# Patient Record
Sex: Female | Born: 1995 | Race: Black or African American | Hispanic: No | Marital: Single | Smoking: Never smoker
Health system: Southern US, Community
[De-identification: ages and names within clinical notes are randomized; demographics above are authoritative.]

## PROBLEM LIST (undated history)

## (undated) ENCOUNTER — Inpatient Hospital Stay (HOSPITAL_COMMUNITY): Payer: Self-pay

## (undated) DIAGNOSIS — J45909 Unspecified asthma, uncomplicated: Secondary | ICD-10-CM

## (undated) HISTORY — DX: Unspecified asthma, uncomplicated: J45.909

---

## 2014-10-18 ENCOUNTER — Encounter (HOSPITAL_COMMUNITY): Payer: Self-pay | Admitting: Emergency Medicine

## 2014-10-18 ENCOUNTER — Emergency Department (HOSPITAL_COMMUNITY)
Admission: EM | Admit: 2014-10-18 | Discharge: 2014-10-18 | Disposition: A | Discharge status: Home / Self Care | Payer: No Typology Code available for payment source | Attending: Emergency Medicine | Admitting: Emergency Medicine

## 2014-10-18 DIAGNOSIS — Y9389 Activity, other specified: Secondary | ICD-10-CM | POA: Insufficient documentation

## 2014-10-18 DIAGNOSIS — M62838 Other muscle spasm: Secondary | ICD-10-CM | POA: Insufficient documentation

## 2014-10-18 DIAGNOSIS — Y998 Other external cause status: Secondary | ICD-10-CM | POA: Diagnosis not present

## 2014-10-18 DIAGNOSIS — S199XXA Unspecified injury of neck, initial encounter: Secondary | ICD-10-CM | POA: Diagnosis not present

## 2014-10-18 DIAGNOSIS — Y92410 Unspecified street and highway as the place of occurrence of the external cause: Secondary | ICD-10-CM | POA: Insufficient documentation

## 2014-10-18 MED ORDER — METHOCARBAMOL 500 MG PO TABS
500.0000 mg | ORAL_TABLET | Freq: Four times a day (QID) | ORAL | Status: DC
Start: 2014-10-18 — End: 2020-01-04
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
## 2019-01-17 ENCOUNTER — Ambulatory Visit: Payer: Medicaid Other | Attending: Internal Medicine

## 2019-01-17 DIAGNOSIS — Z20822 Contact with and (suspected) exposure to covid-19: Secondary | ICD-10-CM

## 2019-01-18 LAB — NOVEL CORONAVIRUS, NAA: SARS-CoV-2, NAA: NOT DETECTED

## 2019-05-06 ENCOUNTER — Emergency Department (HOSPITAL_COMMUNITY): Payer: Medicaid Other

## 2019-05-06 ENCOUNTER — Encounter (HOSPITAL_COMMUNITY): Payer: Self-pay | Admitting: Emergency Medicine

## 2019-05-06 ENCOUNTER — Emergency Department (HOSPITAL_COMMUNITY)
Admission: EM | Admit: 2019-05-06 | Discharge: 2019-05-06 | Disposition: A | Discharge status: Home / Self Care | Payer: Medicaid Other | Attending: Emergency Medicine | Admitting: Emergency Medicine

## 2019-05-06 DIAGNOSIS — Z79899 Other long term (current) drug therapy: Secondary | ICD-10-CM | POA: Insufficient documentation

## 2019-05-06 DIAGNOSIS — B9689 Other specified bacterial agents as the cause of diseases classified elsewhere: Secondary | ICD-10-CM

## 2019-05-06 DIAGNOSIS — Z87891 Personal history of nicotine dependence: Secondary | ICD-10-CM | POA: Insufficient documentation

## 2019-05-06 DIAGNOSIS — A5901 Trichomonal vulvovaginitis: Secondary | ICD-10-CM | POA: Insufficient documentation

## 2019-05-06 DIAGNOSIS — N760 Acute vaginitis: Secondary | ICD-10-CM | POA: Insufficient documentation

## 2019-05-06 DIAGNOSIS — N83201 Unspecified ovarian cyst, right side: Secondary | ICD-10-CM

## 2019-05-06 DIAGNOSIS — R102 Pelvic and perineal pain: Secondary | ICD-10-CM | POA: Insufficient documentation

## 2019-05-06 LAB — URINALYSIS, ROUTINE W REFLEX MICROSCOPIC
Bilirubin Urine: NEGATIVE
Glucose, UA: NEGATIVE mg/dL
Ketones, ur: NEGATIVE mg/dL
Nitrite: NEGATIVE
Protein, ur: NEGATIVE mg/dL
Specific Gravity, Urine: 1.026 (ref 1.005–1.030)
pH: 6 (ref 5.0–8.0)

## 2019-05-06 LAB — CBC
HCT: 34.6 % — ABNORMAL LOW (ref 36.0–46.0)
Hemoglobin: 10.6 g/dL — ABNORMAL LOW (ref 12.0–15.0)
MCH: 23 pg — ABNORMAL LOW (ref 26.0–34.0)
MCHC: 30.6 g/dL (ref 30.0–36.0)
MCV: 75.1 fL — ABNORMAL LOW (ref 80.0–100.0)
Platelets: 392 10*3/uL (ref 150–400)
RBC: 4.61 MIL/uL (ref 3.87–5.11)
RDW: 14.5 % (ref 11.5–15.5)
WBC: 12.3 10*3/uL — ABNORMAL HIGH (ref 4.0–10.5)
nRBC: 0 % (ref 0.0–0.2)

## 2019-05-06 LAB — COMPREHENSIVE METABOLIC PANEL
ALT: 25 U/L (ref 0–44)
AST: 38 U/L (ref 15–41)
Albumin: 3.3 g/dL — ABNORMAL LOW (ref 3.5–5.0)
Alkaline Phosphatase: 68 U/L (ref 38–126)
Anion gap: 10 (ref 5–15)
BUN: 11 mg/dL (ref 6–20)
CO2: 23 mmol/L (ref 22–32)
Calcium: 8.9 mg/dL (ref 8.9–10.3)
Chloride: 107 mmol/L (ref 98–111)
Creatinine, Ser: 0.91 mg/dL (ref 0.44–1.00)
GFR calc Af Amer: >60 mL/min (ref >60)
GFR calc non Af Amer: >60 mL/min (ref >60)
Glucose, Bld: 97 mg/dL (ref 70–99)
Potassium: 3.7 mmol/L (ref 3.5–5.1)
Sodium: 140 mmol/L (ref 135–145)
Total Bilirubin: <0.1 mg/dL — ABNORMAL LOW (ref 0.3–1.2)
Total Protein: 7.3 g/dL (ref 6.5–8.1)

## 2019-05-06 LAB — WET PREP, GENITAL
Sperm: NONE SEEN
Yeast Wet Prep HPF POC: NONE SEEN

## 2019-05-06 LAB — LIPASE, BLOOD: Lipase: 30 U/L (ref 11–51)

## 2019-05-06 LAB — I-STAT BETA HCG BLOOD, ED (MC, WL, AP ONLY): I-stat hCG, quantitative: <5 m[IU]/mL (ref <5)

## 2019-05-06 MED ORDER — CEFTRIAXONE SODIUM 500 MG IJ SOLR
500.0000 mg | Freq: Once | INTRAMUSCULAR | Status: AC
  Administered 2019-05-06: 500 mg via INTRAMUSCULAR
  Filled 2019-05-06: qty 500

## 2019-05-06 MED ORDER — STERILE WATER FOR INJECTION IJ SOLN
INTRAMUSCULAR | Status: AC
  Administered 2019-05-06: 1 mL
  Filled 2019-05-06: qty 10

## 2019-05-06 MED ORDER — METRONIDAZOLE 500 MG PO TABS: 500.0000 mg | ORAL_TABLET | Freq: Two times a day (BID) | ORAL | 0 refills | Status: DC

## 2019-05-06 MED ORDER — ONDANSETRON 4 MG PO TBDP
4.0000 mg | ORAL_TABLET | Freq: Once | ORAL | Status: AC
  Administered 2019-05-06: 4 mg via ORAL
  Filled 2019-05-06: qty 1

## 2019-05-06 MED ORDER — AZITHROMYCIN 250 MG PO TABS
1000.0000 mg | ORAL_TABLET | Freq: Once | ORAL | Status: AC
  Administered 2019-05-06: 1000 mg via ORAL
  Filled 2019-05-06: qty 4

## 2019-05-06 MED ORDER — IOHEXOL 350 MG/ML SOLN: 100.0000 mL | Freq: Once | INTRAVENOUS | Status: DC | PRN

## 2019-05-06 MED ORDER — METRONIDAZOLE 500 MG PO TABS
500.0000 mg | ORAL_TABLET | Freq: Once | ORAL | Status: AC
  Administered 2019-05-06: 500 mg via ORAL
  Filled 2019-05-06: qty 1

## 2019-05-06 MED ORDER — SODIUM CHLORIDE 0.9% FLUSH: 3.0000 mL | Freq: Once | INTRAVENOUS | Status: DC

## 2019-05-06 MED ORDER — IOHEXOL 300 MG/ML  SOLN
100.0000 mL | Freq: Once | INTRAMUSCULAR | Status: AC | PRN
  Administered 2019-05-06: 100 mL via INTRAVENOUS

## 2019-05-06 MED ORDER — ACETAMINOPHEN 500 MG PO TABS
1000.0000 mg | ORAL_TABLET | Freq: Once | ORAL | Status: AC
  Administered 2019-05-06: 1000 mg via ORAL
  Filled 2019-05-06: qty 2

## 2019-05-06 MED ORDER — SODIUM CHLORIDE 0.9 % IV SOLN: Freq: Once | INTRAVENOUS | Status: DC

## 2019-05-06 NOTE — ED Provider Notes (Signed)
[facility] EMERGENCY DEPARTMENT Provider Note   CSN: [id number] Arrival date & time: 05/06/19  0154     History Chief Complaint  Patient presents with  . Abdominal Pain    Kimberly Campos is a 24 y.o. female.  HPI Patient is a 24 year old female with no past surgical history no pertinent past medical history presented today with right lower quadrant abdominal pain that is severe, achy, radiates to her back, worse with walking.  She states there is when she takes a deep breath but is not painful with normal breathing.  She denies any cough or shortness of breath.  She denies any chest pain or nausea or vomiting.  She states she has had some pain with intercourse for the past 7 days and states that this pain has been constant for 7 days.  She denies any vaginal discharge, urinary symptoms, headache, lightheadedness, fevers, chills, changes in appetite.  Patient states she denies any progression of pain from the umbilicus to the right lower quadrant abdomen.      History reviewed. No pertinent past medical history.  There are no problems to display for this patient.   History reviewed. No pertinent surgical history.   OB History   No obstetric history on file.     No family history on file.  Social History   Tobacco Use  . Smoking status: Former Games developer  . Smokeless tobacco: Never Used  Substance Use Topics  . Alcohol use: Yes  . Drug use: No    Home Medications Prior to Admission medications   Medication Sig Start Date End Date Taking? Authorizing Provider  methocarbamol (ROBAXIN) 500 MG tablet Take 1 tablet (500 mg total) by mouth 4 (four) times daily. 10/18/14   Pisciotta, Joni Reining, PA-C  metroNIDAZOLE (FLAGYL) 500 MG tablet Take 1 tablet (500 mg total) by mouth 2 (two) times daily. 05/06/19   Gailen Shelter, PA    Allergies    Coconut flavor  Review of Systems   Review of Systems  Constitutional: Negative for chills and fever.  HENT:  Negative for congestion.   Eyes: Negative for pain.  Respiratory: Negative for cough and shortness of breath.   Cardiovascular: Negative for chest pain and leg swelling.  Gastrointestinal: Positive for abdominal pain. Negative for constipation, diarrhea, nausea and vomiting.  Genitourinary: Positive for pelvic pain. Negative for dysuria, frequency, genital sores, hematuria, menstrual problem, vaginal bleeding, vaginal discharge and vaginal pain.  Musculoskeletal: Negative for myalgias.  Skin: Negative for rash.  Neurological: Negative for dizziness and headaches.    Physical Exam Updated Vital Signs BP 116/74 (BP Location: Right Arm)   Pulse 67   Temp 98.7 F (37.1 C) (Oral)   Resp 17   Ht 5\' 8"  (1.727 m)   Wt 75.3 kg   LMP 04/15/2019   SpO2 98%   BMI 25.24 kg/m   Physical Exam Vitals and nursing note reviewed.  Constitutional:      General: She is not in acute distress. HENT:     Head: Normocephalic and atraumatic.     Nose: Nose normal.  Eyes:     General: No scleral icterus. Cardiovascular:     Rate and Rhythm: Normal rate and regular rhythm.     Pulses: Normal pulses.     Heart sounds: Normal heart sounds.  Pulmonary:     Effort: Pulmonary effort is normal. No respiratory distress.     Breath sounds: No wheezing.  Abdominal:     Palpations: Abdomen is  soft.     Tenderness: There is abdominal tenderness.     Comments: Right lower quadrant tenderness to palpation, right periumbilical tenderness to palpation, left lower quadrant tenderness to palpation.  No guarding or rigidity.  No rebound tenderness.  Positive heel jar.  Positive obturator sign negative psoas.   Genitourinary:    Cervix: Normal.     Comments: Vulva without lesions or abnormality Vaginal canal without with malodorous watery discharge Cervix appears normal, is closed, spotting present. Significant right adnexal tenderness, no left adnexal tenderness, no cervical motion tenderness. Musculoskeletal:       Cervical back: Normal range of motion.     Right lower leg: No edema.     Left lower leg: No edema.  Skin:    General: Skin is warm and dry.     Capillary Refill: Capillary refill takes less than 2 seconds.  Neurological:     Mental Status: She is alert. Mental status is at baseline.  Psychiatric:        Mood and Affect: Mood normal.        Behavior: Behavior normal.     ED Results / Procedures / Treatments   Labs (all labs ordered are listed, but only abnormal results are displayed) Labs Reviewed  WET PREP, GENITAL - Abnormal; Notable for the following components:      Result Value   Trich, Wet Prep PRESENT (*)    Clue Cells Wet Prep HPF POC PRESENT (*)    WBC, Wet Prep HPF POC MANY (*)    All other components within normal limits  COMPREHENSIVE METABOLIC PANEL - Abnormal; Notable for the following components:   Albumin 3.3 (*)    Total Bilirubin <0.1 (*)    All other components within normal limits  CBC - Abnormal; Notable for the following components:   WBC 12.3 (*)    Hemoglobin 10.6 (*)    HCT 34.6 (*)    MCV 75.1 (*)    MCH 23.0 (*)    All other components within normal limits  URINALYSIS, ROUTINE W REFLEX MICROSCOPIC - Abnormal; Notable for the following components:   APPearance HAZY (*)    Hgb urine dipstick SMALL (*)    Leukocytes,Ua MODERATE (*)    Bacteria, UA RARE (*)    All other components within normal limits  LIPASE, BLOOD  I-STAT BETA HCG BLOOD, ED ([facility], [facility], [facility] ONLY)  GC/CHLAMYDIA PROBE AMP ([place]) NOT AT [facility]    EKG None  Radiology CT ABDOMEN PELVIS W CONTRAST  Result Date: 05/06/2019 CLINICAL DATA:  Lower abdominal pain, worse on the right, appendicitis suspected EXAM: CT ABDOMEN AND PELVIS WITH CONTRAST TECHNIQUE: Multidetector CT imaging of the abdomen and pelvis was performed using the standard protocol following bolus administration of intravenous contrast. CONTRAST:  OMNIPAQUE IOHEXOL 300 MG/ML  SOLN COMPARISON:  None.  FINDINGS: Lower chest: No acute abnormality. Hepatobiliary: No solid liver abnormality is seen. No gallstones, gallbladder wall thickening, or biliary dilatation. Pancreas: Unremarkable. No pancreatic ductal dilatation or surrounding inflammatory changes. Spleen: Normal in size without significant abnormality. Adrenals/Urinary Tract: Adrenal glands are unremarkable. Kidneys are normal, without renal calculi, solid lesion, or hydronephrosis. Bladder is unremarkable. Stomach/Bowel: Stomach is within normal limits. Appendix appears normal. No evidence of bowel wall thickening, distention, or inflammatory changes. Vascular/Lymphatic: No significant vascular findings are present. No enlarged abdominal or pelvic lymph nodes. Reproductive: The right ovary is enlarged and heterogeneous in appearance, containing multiple cystic and solid components measuring 6.4 x 4.1 cm (  series 4, image 71) Other: No abdominal wall hernia or abnormality. Trace fluid in the low pelvis. Musculoskeletal: No acute or significant osseous findings. IMPRESSION: 1.  Normal appendix. 2. The right ovary is enlarged and heterogeneous in appearance, containing multiple cystic and solid components measuring 6.4 x 4.1 cm, this appearance better characterized by prior ultrasound. 3.  Trace, nonspecific fluid in the low pelvis. Electronically Signed   By: Eddie Candle M.D.   On: 05/06/2019 12:08   US PELVIC COMPLETE W TRANSVAGINAL AND TORSION R/O  Result Date: 05/06/2019 CLINICAL DATA:  Lower abdominal pain for 1 week, worse on the right, left adnexal tenderness to palpation EXAM: TRANSABDOMINAL AND TRANSVAGINAL ULTRASOUND OF PELVIS DOPPLER ULTRASOUND OF OVARIES TECHNIQUE: Both transabdominal and transvaginal ultrasound examinations of the pelvis were performed. Transabdominal technique was performed for global imaging of the pelvis including uterus, ovaries, adnexal regions, and pelvic cul-de-sac. It was necessary to proceed with endovaginal exam  following the transabdominal exam to visualize the uterus, endometrium, ovaries, and adnexa. Color and duplex Doppler ultrasound was utilized to evaluate blood flow to the ovaries. COMPARISON:  None. FINDINGS: Uterus Measurements: 7.2 x 4.4 x 5.0 cm = volume: 84 mL. There are small mural fibroids in the anterior and posterior uterine body measuring 1.6 and 0.7 cm respectively, which do not clearly have significant submucosal components. Endometrium Thickness: 9 mm.  No focal abnormality visualized. Right ovary Measurements: 6.2 x 3.4 x 5.1 cm = volume: 55 mL. The right ovary is enlarged by a cyst with internal thin, webbed septations measuring 2.7 cm, consistent with a hemorrhagic cyst, and a heterogeneous solid mass with fatty echogenicity internal components measuring 4.5 cm. Left ovary Measurements: 4.0 x 1.0 x 2.3 cm = volume: 5 mL. Normal appearance/no adnexal mass. Pulsed Doppler evaluation of both ovaries demonstrates normal low-resistance arterial and venous waveforms. Other findings Small volume free fluid in the low pelvis. IMPRESSION: 1. 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 There is normal arterial and venous Doppler flow present to the ovary, however intermittent or incomplete torsion cannot be strictly excluded in any enlarged ovary in the setting of acutely referable pain. 2.  Normal left ovary. 3.  Uterine fibroids. 4.  Small volume nonspecific free fluid in the low pelvis. Electronically Signed   By: Eddie Candle M.D.   On: 05/06/2019 11:21    Procedures Ultrasound ED Peripheral IV (Provider)  Date/Time: 05/06/2019 10:24 AM Performed by: Tedd Sias, PA Authorized by: Tedd Sias, PA   Procedure details:    Indications comment:  Assisting nursing staff   Skin Prep:  chlorhexidine gluconate     Location:  Left AC   Angiocath:  18 G   Bedside Ultrasound Guided: No     Patient tolerated procedure without complications: Yes     Dressing applied: Yes     (including critical care time)   Medications Ordered in ED Medications  sodium chloride flush (NS) 0.9 % injection 3 mL (has no administration in time range)  0.9 %  sodium chloride infusion ( Intravenous Not Given 05/06/19 1258)  iohexol (OMNIPAQUE) 300 MG/ML solution 100 mL (100 mLs Intravenous Contrast Given 05/06/19 1049)  acetaminophen (TYLENOL) tablet 1,000 mg (1,000 mg Oral Given 05/06/19 1257)  metroNIDAZOLE (FLAGYL) tablet 500 mg (500 mg Oral Given 05/06/19 1257)  cefTRIAXone (  ROCEPHIN) injection 500 mg (500 mg Intramuscular Given 05/06/19 1257)  azithromycin (ZITHROMAX) tablet 1,000 mg (1,000 mg Oral Given 05/06/19 1257)  ondansetron (ZOFRAN-ODT) disintegrating tablet 4 mg (4 mg Oral Given 05/06/19 1257)  sterile water (preservative free) injection (1 mL  Given 05/06/19 1258)    ED Course  I have reviewed the triage vital signs and the nursing notes.  Pertinent labs & imaging results that were available during my care of the patient were reviewed by me and considered in my medical decision making (see chart for details).   Clinical Course as of May 06 1311  Sun May 06, 2019  0858 CBC with very mild leukocytosis of 12.3.  Hemoglobin 10.6 patient states that she has had low hemoglobins in the past.  No prior labs to compare this to today.  She is not tachycardic or hypotensive-she is not symptomatically anemic today   [WF]  0858 CMP without acute abnormality electrolytes within normal limits.   [WF]  0859 I-STAT hCG negative.  Lipase within normal limits doubt pancreatitis.  Urinalysis pending.   [WF]  1214 Trichomonas, clue cells consistent with BV present on exam.  Wet prep, genital(!) [WF]    Clinical Course User Index [WF] Solon Augusta S, PA  Months of infection.  She has had some  spotting from her cervix on my exam which I suspect accounts for the small amount of globin on her urinalysis.  She has been moderate leukocytes and rare bacteria however has no urinary symptoms.  Doubt UTI.   I discussed this case with my attending physician who cosigned this note including patient's presenting symptoms, physical exam, and planned diagnostics and interventions. Attending physician stated agreement with plan or made changes to plan which were implemented.   Patient treated for gonorrhea chlamydia empirically, treated for trichomonas and BV with Flagyl for 1 week.  She will follow up with her OB/GYN.  She was discharged with normal vital signs, is tolerating p.o., well-appearing, and has had no nausea or vomiting.  Will provide patient with Tylenol ibuprofen recommendations.  Patient given strict return precautions.  Doubt ovarian torsion given the ultrasound results.  Although intermittent torsion cannot be ruled out she states she was having pain during the entirety of the ultrasound exam which means that if her symptoms are due to torsion it is unlikely that it would be missed by ultrasound.  CT scan independently viewed myself shows significantly sized hemorrhagic cyst.  I discussed these results with the patient and her boyfriend at bedside.  They are understanding.   Husband provided with handwritten prescription for trichomonas treatment.  MDM Rules/Calculators/A&P                      Final Clinical Impression(s) / ED Diagnoses Final diagnoses:  Hemorrhagic cyst of right ovary  Trichomonas vaginitis  BV (bacterial vaginosis)    Rx / DC Orders ED Discharge Orders         Ordered    metroNIDAZOLE (FLAGYL) 500 MG tablet  2 times daily     05/06/19 1234           Solon Augusta [place], [place] 05/06/19 1313    Jacalyn Lefevre, MD 05/08/19 1831

## 2019-05-06 NOTE — ED Triage Notes (Signed)
Pt c/o RLQ pain x7days,pain worse tonight. Worse with walking/breathing and intercourse. Pt denies nvd. Denies gyn s/s, denies urinary s/s

## 2019-05-06 NOTE — ED Notes (Signed)
Pt. Returned from CT/US

## 2019-05-06 NOTE — ED Notes (Signed)
Pt. To CT

## 2019-05-06 NOTE — Discharge Instructions (Addendum)
You are diagnosed with a hemorrhagic cyst of the right ovary, trichomonas, BV.  Please take antibiotics as prescribed.  Please follow-up with your OB/GYN.  Please use Tylenol or ibuprofen for pain.  You may use 600 mg ibuprofen every 6 hours or 1000 mg of Tylenol every 6 hours.  You may choose to alternate between the 2.  This would be most effective.  Not to exceed 4 g of Tylenol within 24 hours.  Not to exceed 3200 mg ibuprofen 24 hours.  Please wait 2 weeks AND be sure that you and your partners are symptom free before returning to sexual activity. Please use protection with every sexual encounter.

## 2019-05-07 LAB — GC/CHLAMYDIA PROBE AMP (~~LOC~~) NOT AT ARMC
Chlamydia: POSITIVE — AB
Comment: NEGATIVE
Comment: NORMAL
Neisseria Gonorrhea: NEGATIVE

## 2020-01-04 ENCOUNTER — Encounter (HOSPITAL_COMMUNITY): Payer: Self-pay | Admitting: Emergency Medicine

## 2020-01-04 ENCOUNTER — Emergency Department (HOSPITAL_COMMUNITY)
Admission: EM | Admit: 2020-01-04 | Discharge: 2020-01-04 | Disposition: A | Discharge status: Home / Self Care | Payer: Medicaid Other | Attending: Emergency Medicine | Admitting: Emergency Medicine

## 2020-01-04 DIAGNOSIS — S61411A Laceration without foreign body of right hand, initial encounter: Secondary | ICD-10-CM | POA: Insufficient documentation

## 2020-01-04 DIAGNOSIS — Z87891 Personal history of nicotine dependence: Secondary | ICD-10-CM | POA: Insufficient documentation

## 2020-01-04 DIAGNOSIS — Z23 Encounter for immunization: Secondary | ICD-10-CM | POA: Insufficient documentation

## 2020-01-04 MED ORDER — TETANUS-DIPHTH-ACELL PERTUSSIS 5-2.5-18.5 LF-MCG/0.5 IM SUSY
0.5000 mL | PREFILLED_SYRINGE | Freq: Once | INTRAMUSCULAR | Status: AC
  Administered 2020-01-04: 0.5 mL via INTRAMUSCULAR
  Filled 2020-01-04: qty 0.5

## 2020-01-04 MED ORDER — CEPHALEXIN 500 MG PO CAPS
1000.0000 mg | ORAL_CAPSULE | Freq: Once | ORAL | Status: AC
  Administered 2020-01-04: 1000 mg via ORAL
  Filled 2020-01-04: qty 2

## 2020-01-04 MED ORDER — LIDOCAINE-EPINEPHRINE 2 %-1:100000 IJ SOLN
20.0000 mL | Freq: Once | INTRAMUSCULAR | Status: AC
Start: 2020-01-04 — End: 2020-01-04
  Administered 2020-01-04: 20 mL
  Filled 2020-01-04: qty 1

## 2020-01-04 MED ORDER — BACITRACIN ZINC 500 UNIT/GM EX OINT
TOPICAL_OINTMENT | CUTANEOUS | Status: AC
  Filled 2020-01-04: qty 0.9
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
## 2021-05-14 ENCOUNTER — Encounter (HOSPITAL_COMMUNITY): Payer: Self-pay | Admitting: Emergency Medicine

## 2021-05-14 ENCOUNTER — Emergency Department (HOSPITAL_COMMUNITY)
Admission: EM | Admit: 2021-05-14 | Discharge: 2021-05-14 | Disposition: A | Discharge status: Home / Self Care | Payer: Medicaid Other | Attending: Emergency Medicine | Admitting: Emergency Medicine

## 2021-05-14 ENCOUNTER — Emergency Department (HOSPITAL_COMMUNITY): Payer: Medicaid Other

## 2021-05-14 DIAGNOSIS — Y92410 Unspecified street and highway as the place of occurrence of the external cause: Secondary | ICD-10-CM | POA: Insufficient documentation

## 2021-05-14 DIAGNOSIS — S0990XA Unspecified injury of head, initial encounter: Secondary | ICD-10-CM | POA: Diagnosis not present

## 2021-05-14 DIAGNOSIS — R0781 Pleurodynia: Secondary | ICD-10-CM | POA: Insufficient documentation

## 2021-05-14 MED ORDER — METHOCARBAMOL 500 MG PO TABS: 500.0000 mg | ORAL_TABLET | Freq: Three times a day (TID) | ORAL | 0 refills | Status: DC | PRN

## 2021-05-14 MED ORDER — NAPROXEN 250 MG PO TABS
500.0000 mg | ORAL_TABLET | Freq: Once | ORAL | Status: AC
Start: 2021-05-14 — End: 2021-05-14
  Administered 2021-05-14: 500 mg via ORAL
  Filled 2021-05-14: qty 2
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
## 2021-05-14 NOTE — Discharge Instructions (Signed)
Take Tylenol Motrin as needed for pain control.  Can take the prescribed muscle relaxer as needed as well.  Come back to ER for abdominal pain, chest pain, difficulty breathing or other new concerning symptom. ?
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
## 2023-01-20 ENCOUNTER — Inpatient Hospital Stay (HOSPITAL_COMMUNITY): Payer: Medicaid Other

## 2023-01-20 ENCOUNTER — Other Ambulatory Visit: Payer: Self-pay

## 2023-01-20 ENCOUNTER — Encounter (HOSPITAL_COMMUNITY): Payer: Self-pay | Admitting: Emergency Medicine

## 2023-01-20 ENCOUNTER — Inpatient Hospital Stay (HOSPITAL_COMMUNITY)
Admission: AD | Admit: 2023-01-20 | Discharge: 2023-01-21 | Disposition: A | Discharge status: Home / Self Care | Payer: Medicaid Other | Attending: Certified Nurse Midwife | Admitting: Certified Nurse Midwife

## 2023-01-20 DIAGNOSIS — Z3201 Encounter for pregnancy test, result positive: Secondary | ICD-10-CM | POA: Insufficient documentation

## 2023-01-20 DIAGNOSIS — Z3A01 Less than 8 weeks gestation of pregnancy: Secondary | ICD-10-CM

## 2023-01-20 DIAGNOSIS — O3680X0 Pregnancy with inconclusive fetal viability, not applicable or unspecified: Secondary | ICD-10-CM | POA: Diagnosis not present

## 2023-01-20 DIAGNOSIS — O209 Hemorrhage in early pregnancy, unspecified: Secondary | ICD-10-CM | POA: Diagnosis present

## 2023-01-20 DIAGNOSIS — D259 Leiomyoma of uterus, unspecified: Secondary | ICD-10-CM | POA: Diagnosis not present

## 2023-01-20 DIAGNOSIS — O23591 Infection of other part of genital tract in pregnancy, first trimester: Secondary | ICD-10-CM | POA: Insufficient documentation

## 2023-01-20 DIAGNOSIS — N939 Abnormal uterine and vaginal bleeding, unspecified: Secondary | ICD-10-CM

## 2023-01-20 DIAGNOSIS — O3411 Maternal care for benign tumor of corpus uteri, first trimester: Secondary | ICD-10-CM | POA: Diagnosis not present

## 2023-01-20 DIAGNOSIS — N760 Acute vaginitis: Secondary | ICD-10-CM

## 2023-01-20 DIAGNOSIS — B9689 Other specified bacterial agents as the cause of diseases classified elsewhere: Secondary | ICD-10-CM | POA: Insufficient documentation

## 2023-01-20 LAB — WET PREP, GENITAL
Sperm: NONE SEEN
Trich, Wet Prep: NONE SEEN
WBC, Wet Prep HPF POC: <10 (ref <10)
Yeast Wet Prep HPF POC: NONE SEEN

## 2023-01-20 LAB — CBC
HCT: 34.5 % — ABNORMAL LOW (ref 36.0–46.0)
Hemoglobin: 10.5 g/dL — ABNORMAL LOW (ref 12.0–15.0)
MCH: 21.7 pg — ABNORMAL LOW (ref 26.0–34.0)
MCHC: 30.4 g/dL (ref 30.0–36.0)
MCV: 71.3 fL — ABNORMAL LOW (ref 80.0–100.0)
Platelets: 400 10*3/uL (ref 150–400)
RBC: 4.84 MIL/uL (ref 3.87–5.11)
RDW: 16.9 % — ABNORMAL HIGH (ref 11.5–15.5)
WBC: 8.7 10*3/uL (ref 4.0–10.5)
nRBC: 0 % (ref 0.0–0.2)

## 2023-01-20 LAB — HCG, SERUM, QUALITATIVE: Preg, Serum: POSITIVE — AB

## 2023-01-20 NOTE — ED Provider Notes (Signed)
 Discussed patient with MAU.  Spoke to [facility].  Patient's LMP was in October.  She started with vaginal bleeding.  No abdominal pain.  Hemodynamically stable.  Pregnancy test positive.  Hemoglobin stable.  MAU will accept patient in transfer.    Hildegard Loge, PA-C 01/20/23 1956    Randol Simmonds, MD 01/20/23 2234

## 2023-01-20 NOTE — MAU Note (Signed)
.  Kimberly Campos is a 28 y.o. at Unknown here in MAU reporting VB few days ago and thought it was her period. Bleeding stopped that day. WEnt to ED tonight due to pain both sides of abdomen. Had positive upt today  LMP: 10/23/22 Onset of complaint: today Pain score: 4 Vitals:   01/20/23 2129 01/20/23 2134  BP:  (!) 124/59  Pulse: 71   Resp: 16   Temp: 97.9 F (36.6 C)   SpO2: 100%      FHT:n/a Lab orders placed from triage: u/a, wet prep, gc/chlam

## 2023-01-20 NOTE — ED Triage Notes (Signed)
 LMP October. Positive home pregnancy test.  Spotting yesterday  None today. No abdominal pain. No urinary symptoms. No n/v/d or shob.

## 2023-01-20 NOTE — MAU Provider Note (Signed)
 None     S Ms. Rhina Kramme is a 28 y.o. G1P0 pregnant/non-pregnant female at Unknown who presents to MAU today with complaint of heavy vaginal bleeding that became lighter. Reports she is having no pain now.   Has not yet established prenatal care.   Pertinent items noted in HPI and remainder of comprehensive ROS otherwise negative.   O BP 138/77   Pulse 71   Temp 97.9 F (36.6 C)   Resp 16   Ht 5' 8 (1.727 m)   Wt 91 kg   LMP 10/23/2022   SpO2 100%   BMI 30.50 kg/m  Physical Exam Vitals reviewed.  Constitutional:      General: She is not in acute distress.    Appearance: Normal appearance. She is not ill-appearing or toxic-appearing.  HENT:     Head: Normocephalic.  Cardiovascular:     Rate and Rhythm: Normal rate.     Pulses: Normal pulses.  Pulmonary:     Effort: Pulmonary effort is normal.  Skin:    General: Skin is warm and dry.     Capillary Refill: Capillary refill takes less than 2 seconds.  Neurological:     Mental Status: She is alert and oriented to person, place, and time.  Psychiatric:        Mood and Affect: Mood normal.        Behavior: Behavior normal.        Thought Content: Thought content normal.        Judgment: Judgment normal.    Results for orders placed or performed during the hospital encounter of 01/20/23 (from the past 24 hours)  hCG, serum, qualitative     Status: Abnormal   Collection Time: 01/20/23  5:46 PM  Result Value Ref Range   Preg, Serum POSITIVE (A) NEGATIVE  CBC     Status: Abnormal   Collection Time: 01/20/23  5:46 PM  Result Value Ref Range   WBC 8.7 4.0 - 10.5 K/uL   RBC 4.84 3.87 - 5.11 MIL/uL   Hemoglobin 10.5 (L) 12.0 - 15.0 g/dL   HCT 65.4 (L) 63.9 - 53.9 %   MCV 71.3 (L) 80.0 - 100.0 fL   MCH 21.7 (L) 26.0 - 34.0 pg   MCHC 30.4 30.0 - 36.0 g/dL   RDW 83.0 (H) 88.4 - 84.4 %   Platelets 400 150 - 400 K/uL   nRBC 0.0 0.0 - 0.2 %  Wet prep, genital     Status: Abnormal   Collection Time: 01/20/23  9:45 PM    Specimen: Vaginal  Result Value Ref Range   Yeast Wet Prep HPF POC NONE SEEN NONE SEEN   Trich, Wet Prep NONE SEEN NONE SEEN   Clue Cells Wet Prep HPF POC PRESENT (A) NONE SEEN   WBC, Wet Prep HPF POC <10 <10   Sperm NONE SEEN   ABO/Rh     Status: None   Collection Time: 01/20/23 11:29 PM  Result Value Ref Range   ABO/RH(D) O POS    No rh immune globuloin      NOT A RH IMMUNE GLOBULIN CANDIDATE, PT RH POSITIVE Performed at [facility], [address], [place], [place] [address]   hCG, quantitative, pregnancy     Status: Abnormal   Collection Time: 01/20/23 11:32 PM  Result Value Ref Range   hCG, Beta Chain, Quant, S 1,655 (H) <5 mIU/mL  Urinalysis, Routine w reflex microscopic -Urine, Clean Catch     Status:  None   Collection Time: 01/21/23 12:17 AM  Result Value Ref Range   Color, Urine YELLOW YELLOW   APPearance CLEAR CLEAR   Specific Gravity, Urine 1.029 1.005 - 1.030   pH 5.0 5.0 - 8.0   Glucose, UA NEGATIVE NEGATIVE mg/dL   Hgb urine dipstick NEGATIVE NEGATIVE   Bilirubin Urine NEGATIVE NEGATIVE   Ketones, ur NEGATIVE NEGATIVE mg/dL   Protein, ur NEGATIVE NEGATIVE mg/dL   Nitrite NEGATIVE NEGATIVE   Leukocytes,Ua NEGATIVE NEGATIVE    MDM: Wet prep, UA, GC, Beta HcG, US  Reviewed records  MAU Course:  A Vaginal bleeding affecting early pregnancy Pregnancy of unknown anatomic location Bacterial vaginosis  Medical screening exam complete  P - Discussed options for pregnancy care. - Counseled on importance of return with worsening pain, bleeding or signs of infection.  - Prescription sent to patient's preferred pharmacy for BV treatment.  - Discharge from MAU in stable condition with ectopic precautions - Follow up at [facility] for stat beta HcG Monday morning. Orders placed and message sent to office to schedule.   No future appointments. Allergies as of 01/21/2023       Reactions   Coconut Flavoring Agent (non-screening) Anaphylaxis         Medication List     TAKE these medications    metroNIDAZOLE  500 MG tablet Commonly known as: FLAGYL  Take 1 tablet (500 mg total) by mouth 2 (two) times daily.        Regino Camie LABOR, CNM 01/21/2023 3:26 AM

## 2023-01-21 ENCOUNTER — Other Ambulatory Visit: Payer: Self-pay | Admitting: Certified Nurse Midwife

## 2023-01-21 DIAGNOSIS — O209 Hemorrhage in early pregnancy, unspecified: Secondary | ICD-10-CM

## 2023-01-21 LAB — HCG, QUANTITATIVE, PREGNANCY: hCG, Beta Chain, Quant, S: 1655 m[IU]/mL — ABNORMAL HIGH (ref <5)

## 2023-01-21 LAB — URINALYSIS, ROUTINE W REFLEX MICROSCOPIC
Bilirubin Urine: NEGATIVE
Glucose, UA: NEGATIVE mg/dL
Hgb urine dipstick: NEGATIVE
Ketones, ur: NEGATIVE mg/dL
Leukocytes,Ua: NEGATIVE
Nitrite: NEGATIVE
Protein, ur: NEGATIVE mg/dL
Specific Gravity, Urine: 1.029 (ref 1.005–1.030)
pH: 5 (ref 5.0–8.0)

## 2023-01-21 LAB — GC/CHLAMYDIA PROBE AMP (~~LOC~~) NOT AT ARMC
Chlamydia: NEGATIVE
Comment: NEGATIVE
Comment: NORMAL
Neisseria Gonorrhea: NEGATIVE

## 2023-01-21 LAB — ABO/RH: ABO/RH(D): O POS

## 2023-01-21 MED ORDER — METRONIDAZOLE 500 MG PO TABS: 500.0000 mg | ORAL_TABLET | Freq: Two times a day (BID) | ORAL | 0 refills | Status: AC

## 2023-01-24 ENCOUNTER — Ambulatory Visit (INDEPENDENT_AMBULATORY_CARE_PROVIDER_SITE_OTHER): Payer: Medicaid Other | Admitting: *Deleted

## 2023-01-24 ENCOUNTER — Other Ambulatory Visit: Payer: Self-pay

## 2023-01-24 VITALS — BP 112/60 | HR 77 | Ht 68.0 in | Wt 202.9 lb

## 2023-01-24 DIAGNOSIS — O3680X0 Pregnancy with inconclusive fetal viability, not applicable or unspecified: Secondary | ICD-10-CM

## 2023-01-24 DIAGNOSIS — Z3A00 Weeks of gestation of pregnancy not specified: Secondary | ICD-10-CM

## 2023-01-24 LAB — BETA HCG QUANT (REF LAB): hCG Quant: 3860 m[IU]/mL

## 2023-01-24 NOTE — Progress Notes (Signed)
 Here for stat bhcg today. Last bhcg 01/20/23 1655 at 23:32pm. States not having pain today but yesterday  right ovary hurt all day . States no further bleeding since she went to MAU. Lab drawn and explained we will call with results and plan of care in a few hours. Rock Skip PEAK 11:50 results received bhcg = 3860. Reviewed results, history and assessment with Dr. Fredirick. Advised stat bhcg in 48 hours, ectopic precautions. I called Savannha and reviewed results and recommendations. She voice understanding and agreed to stat bhcg nurse visit 01/26/23.  Rock Skip PEAK

## 2023-01-26 ENCOUNTER — Ambulatory Visit: Payer: Medicaid Other

## 2023-01-26 ENCOUNTER — Encounter: Payer: Self-pay | Admitting: Family Medicine

## 2023-01-26 VITALS — BP 114/67 | HR 66 | Ht 68.0 in | Wt 200.9 lb

## 2023-01-26 DIAGNOSIS — J45909 Unspecified asthma, uncomplicated: Secondary | ICD-10-CM | POA: Insufficient documentation

## 2023-01-26 DIAGNOSIS — O3680X0 Pregnancy with inconclusive fetal viability, not applicable or unspecified: Secondary | ICD-10-CM

## 2023-01-26 DIAGNOSIS — Z3A00 Weeks of gestation of pregnancy not specified: Secondary | ICD-10-CM

## 2023-01-26 LAB — BETA HCG QUANT (REF LAB): hCG Quant: 5537 m[IU]/mL

## 2023-01-26 NOTE — Progress Notes (Signed)
 Beta HCG Follow-up Visit  Kimberly Campos presents to [facility] for follow-up beta HCG lab. She was seen in MAU for vaginal bleeding on 01/20/23. Patient reports pain today. Discussed with patient that we are following beta HCG levels today. Results will be back in approximately 2 hours. Valid contact number for patient confirmed. I will call the patient with results.   Beta HCG results: On 01/20/23 1655  On 01/24/23 3860  Today 5537   Results and patient history reviewed with Thomasene Flemings MD, who states patient needs US  before end of week to determine location of pregnancy. US  appointment scheduled for tomorrow, 01/27/23 at 0900 at [facility]. Patient called and informed of plan for follow-up--patient verbalized understanding and did not have any other questions or concerns.   Aiyla Baucom A Jonovan Boedecker 01/26/2023 9:06 AM

## 2023-01-27 ENCOUNTER — Ambulatory Visit (HOSPITAL_BASED_OUTPATIENT_CLINIC_OR_DEPARTMENT_OTHER)
Admission: RE | Admit: 2023-01-27 | Discharge: 2023-01-27 | Disposition: A | Discharge status: Home / Self Care | Payer: Medicaid Other | Source: Ambulatory Visit | Attending: Obstetrics and Gynecology | Admitting: Obstetrics and Gynecology

## 2023-01-27 DIAGNOSIS — O3680X0 Pregnancy with inconclusive fetal viability, not applicable or unspecified: Secondary | ICD-10-CM | POA: Diagnosis present

## 2023-02-02 ENCOUNTER — Encounter: Payer: Self-pay | Admitting: Obstetrics and Gynecology

## 2023-02-02 ENCOUNTER — Telehealth: Payer: Self-pay | Admitting: Lactation Services

## 2023-02-02 NOTE — Telephone Encounter (Signed)
Called patient to inform her of upcoming repeat US scheduled for 1/28 at 9:15 at [facility]. She did not answer. LM that I will be sending a Mychart message with an upcoming appointment.

## 2023-02-02 NOTE — Telephone Encounter (Signed)
-----   Message from [place] sent at 02/02/2023  8:39 AM EST ----- Pt has confirmed IUP with yolk sac. Viability is not yet confirmed - recommend a repeat US in 10 days.  Thanks, pad

## 2023-02-08 ENCOUNTER — Other Ambulatory Visit (INDEPENDENT_AMBULATORY_CARE_PROVIDER_SITE_OTHER): Payer: Medicaid Other

## 2023-02-08 ENCOUNTER — Ambulatory Visit (INDEPENDENT_AMBULATORY_CARE_PROVIDER_SITE_OTHER): Payer: Medicaid Other | Admitting: *Deleted

## 2023-02-08 VITALS — BP 133/75 | HR 73

## 2023-02-08 DIAGNOSIS — Z3A01 Less than 8 weeks gestation of pregnancy: Secondary | ICD-10-CM

## 2023-02-08 DIAGNOSIS — F419 Anxiety disorder, unspecified: Secondary | ICD-10-CM

## 2023-02-08 DIAGNOSIS — Z3401 Encounter for supervision of normal first pregnancy, first trimester: Secondary | ICD-10-CM

## 2023-02-08 DIAGNOSIS — O3680X0 Pregnancy with inconclusive fetal viability, not applicable or unspecified: Secondary | ICD-10-CM

## 2023-02-08 MED ORDER — MICONAZOLE NITRATE 2 % VA CREA: 1.0000 | TOPICAL_CREAM | Freq: Every day | VAGINAL | 0 refills | Status: AC

## 2023-02-08 NOTE — Addendum Note (Signed)
Addended by: Scheryl Marten on: 02/08/2023 10:06 AM   Modules accepted: Orders

## 2023-02-08 NOTE — Progress Notes (Signed)
Dating Scan  I explained I am completing a dating/viability scan today. Pt receives care at [facility]. We discussed EDD of 09/25/2023, by Ultrasound. Pt is G1P0.   Concerns addressed today  Patient informed that the ultrasound is considered a limited obstetric ultrasound and is not intended to be a complete ultrasound exam.  Patient also informed that the ultrasound is not being completed with the intent of assessing for fetal or placental anomalies or any pelvic abnormalities. Explained that the purpose of today's ultrasound is to assess for viability.  Patient acknowledges the purpose of the exam and the limitations of the study.     Pt unsure if she wants to continue with the pregnancy. Very tearful and upset. Discussed resources and referral placed to [facility].  Pt has appt on 1/29 at 8:15 am with [facility]. Advised patient to follow up as needed for either prenatal care or follow up to discuss birth control.  Scheryl Marten, RN

## 2023-02-15 ENCOUNTER — Other Ambulatory Visit: Payer: Self-pay

## 2023-02-15 ENCOUNTER — Inpatient Hospital Stay (HOSPITAL_COMMUNITY): Payer: Medicaid Other

## 2023-02-15 ENCOUNTER — Inpatient Hospital Stay (HOSPITAL_COMMUNITY)
Admission: AD | Admit: 2023-02-15 | Discharge: 2023-02-15 | Disposition: A | Discharge status: Home / Self Care | Payer: Medicaid Other | Attending: Obstetrics and Gynecology | Admitting: Obstetrics and Gynecology

## 2023-02-15 DIAGNOSIS — O208 Other hemorrhage in early pregnancy: Secondary | ICD-10-CM

## 2023-02-15 DIAGNOSIS — Z3A08 8 weeks gestation of pregnancy: Secondary | ICD-10-CM

## 2023-02-15 DIAGNOSIS — O99511 Diseases of the respiratory system complicating pregnancy, first trimester: Secondary | ICD-10-CM | POA: Insufficient documentation

## 2023-02-15 DIAGNOSIS — O209 Hemorrhage in early pregnancy, unspecified: Secondary | ICD-10-CM

## 2023-02-15 DIAGNOSIS — D251 Intramural leiomyoma of uterus: Secondary | ICD-10-CM | POA: Diagnosis not present

## 2023-02-15 DIAGNOSIS — O26891 Other specified pregnancy related conditions, first trimester: Secondary | ICD-10-CM | POA: Insufficient documentation

## 2023-02-15 DIAGNOSIS — O3411 Maternal care for benign tumor of corpus uteri, first trimester: Secondary | ICD-10-CM | POA: Diagnosis not present

## 2023-02-15 DIAGNOSIS — D259 Leiomyoma of uterus, unspecified: Secondary | ICD-10-CM

## 2023-02-15 DIAGNOSIS — O200 Threatened abortion: Secondary | ICD-10-CM

## 2023-02-15 NOTE — MAU Note (Addendum)
 Kimberly Campos is a 28 y.o. at [redacted]w[redacted]d here in MAU reporting: she believes she's having a miscarriage.  Reports woke with VB, states soaked undergarments and passed 1 nickel sized clot.  States also having  intermittent sharp pain that worsens when standing.  RN assessed VB, no bleeding noted on sanitary napkin.  LMP: NA Onset of complaint: 0600 today Pain score: 3 Vitals:   02/15/23 0731  BP: 134/70  Pulse: 70  Resp: 18  Temp: 98.1 F (36.7 C)  SpO2: 100%     FHT: NA  Lab orders placed from triage: None

## 2023-02-15 NOTE — Discharge Instructions (Signed)
 Return to MAU: If you have heavier bleeding that soaks through more that 2 pads per hour for an hour or more If you bleed so much that you feel like you might pass out or you do pass out If you have significant abdominal pain that is not improved with Tylenol 1000 mg every 8 hours as needed for pain If you develop a fever > 100.5

## 2023-02-15 NOTE — MAU Provider Note (Addendum)
 Chief Complaint: Vaginal Bleeding   Event Date/Time   First Provider Initiated Contact with Patient 02/15/23 0747        SUBJECTIVE HPI: Kimberly Campos is a 28 y.o. G1P0 at [redacted]w[redacted]d by LMP who presents to maternity admissions reporting an episode of bleeding this AM, passing a clot, and dizziness. Dizziness has since passed. Bleeding has not continued. Does have picture that shows she bled through underwear and onto sleep shorts, and picture of clot approximately 2cm in diameter. Does report R sided sharp abdominal pain that occurs with standing. At its worst gets to an 8. Relieved with Tylenol .   Does not worsen with walking or other activity. History of ovarian cyst.  She denies vaginal itching/burning, urinary symptoms, h/a, n/v, or fever/chills.     Past Medical History:  Diagnosis Date   Asthma    resolved per patient   No past surgical history on file. Social History   Socioeconomic History   Marital status: Single    Spouse name: Not on file   Number of children: Not on file   Years of education: Not on file   Highest education level: Not on file  Occupational History   Not on file  Tobacco Use   Smoking status: Never   Smokeless tobacco: Never  Vaping Use   Vaping status: Former   Quit date: 10/23/2022  Substance and Sexual Activity   Alcohol use: Not Currently   Drug use: No   Sexual activity: Yes    Birth control/protection: None  Other Topics Concern   Not on file  Social History Narrative   Not on file   Social Drivers of Health   Financial Resource Strain: Not on file  Food Insecurity: Not on file  Transportation Needs: Not on file  Physical Activity: Not on file  Stress: Not on file  Social Connections: Not on file  Intimate Partner Violence: Not on file   No current facility-administered medications on file prior to encounter.   Current Outpatient Medications on File Prior to Encounter  Medication Sig Dispense Refill   metroNIDAZOLE  (FLAGYL ) 500  MG tablet Take 1 tablet (500 mg total) by mouth 2 (two) times daily. 14 tablet 0   miconazole  (MONISTAT  7 SIMPLY CURE) 2 % vaginal cream Place 1 Applicatorful vaginally at bedtime. 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   ROS:  Review of Systems Review of Systems  Other systems negative   Physical Exam  Physical Exam Patient Vitals for the past 24 hrs:  BP Temp Pulse Resp SpO2 Height Weight  02/15/23 0737 128/82 -- 85 -- 99 % -- --  02/15/23 0734 132/75 -- 74 -- 100 % -- --  02/15/23 0733 128/70 -- 63 -- 100 % -- --  02/15/23 0731 134/70 98.1 F (36.7 C) 70 18 100 % -- --  02/15/23 0724 -- -- -- -- -- 5' 8 (1.727 m) 90.9 kg   Constitutional: Well-developed, well-nourished female in no acute distress.  Cardiovascular: normal rate Respiratory: normal effort GI: Abd soft, Pos BS x 4. Tenderness to palpation in RLQ. No rebound. No guarding. MS: Extremities nontender, no edema, normal ROM Neurologic: Alert and oriented x 4.  GU: Neg CVAT.  LAB RESULTS No results found for this or any previous visit (from the past 24 hours).  --/--/O POS (01/09 2329)  IMAGING US  OB Transvaginal Result Date: 02/15/2023 CLINICAL DATA:  Vaginal bleeding. First trimester pregnancy with inconclusive fetal viability. EXAM: TRANSVAGINAL OB ULTRASOUND TECHNIQUE: Transvaginal ultrasound was performed for complete evaluation of the gestation as well as the maternal uterus, adnexal regions, and pelvic cul-de-sac. COMPARISON:  01/27/2023 FINDINGS: Intrauterine gestational sac: Single Yolk sac:  Visualized. Embryo:  Visualized. Cardiac Activity: Visualized. Heart Rate: 176 bpm CRL:   17 mm   8 w 1 d                  US  EDC: 09/26/2023 Subchorionic hemorrhage:  Small subchorionic hemorrhage noted. Maternal uterus/adnexae: Several uterine fibroids are  seen, largest in the left posterior corpus measuring approximately 4.9 cm. Normal appearance of right ovary. Nonvisualization of left ovary, however, no adnexal mass or abnormal free fluid identified. IMPRESSION: Single living IUP with estimated gestational age of [redacted] weeks 1 day, and US  EDC of 09/26/2023. Small subchorionic hemorrhage. Uterine fibroids, largest measuring approximately 4.9 cm. Electronically Signed   By: Norleen DELENA Kil M.D.   On: 02/15/2023 08:25   US  OB Limited Result Date: 02/10/2023 ----------------------------------------------------------------------  OBSTETRICS REPORT                       (Signed Final 02/10/2023 12:09 am) ---------------------------------------------------------------------- Patient Info  ID #:       [id number]                          D.O.B.:  [birth year] (27 yrs)(F)  Name:       Kimberly Campos                  Visit Date: 02/08/2023 11:03 am ---------------------------------------------------------------------- Performed By  Attending:        Bebe Furry MD     Ref. Address:     [address]                                                             [address]  Performed By:     Wanda Buckles RN     [facility]  Referred By:  [facility] ---------------------------------------------------------------------- Orders  #  Description                           Code        Ordered By  1  US  OB LIMITED                         [id number]     BEBE FURRY ----------------------------------------------------------------------  #  Order #                     Accession #                Episode #  1  [id number]                   [id number]                 [id number] ---------------------------------------------------------------------- Indications  Less  than [redacted] weeks gestation of pregnancy       Z3A.01 ---------------------------------------------------------------------- Fetal Evaluation  Num Of Fetuses:         1  Preg. Location:         Intrauterine  Gest. Sac:              Intrauterine  Yolk Sac:               Visualized  Fetal Pole:             Visualized  Fetal Heart Rate(bpm):  141  Cardiac Activity:       Observed ---------------------------------------------------------------------- Biometry  CRL:      10.5  mm     G. Age:  7w 1d                   EDD:   09/26/23 ---------------------------------------------------------------------- OB History  Gravidity:    1         Term:   0        Prem:   0        SAB:   0  TOP:          0       Ectopic:  0        Living: 0 ---------------------------------------------------------------------- Gestational Age  LMP:           15w 1d        Date:  10/25/22                 EDD:   08/01/23  Best:          7w 1d      Det. By:  U/S C R L (02/08/23)     EDD:   09/26/23 ---------------------------------------------------------------------- Comments  Single living IUP. Dating based off CRL. ---------------------------------------------------------------------- Impression  Reassuring ultrasound ---------------------------------------------------------------------- Recommendations  Start prenatal care ----------------------------------------------------------------------                 Bebe Furry, MD Electronically Signed Final Report   02/10/2023 12:09 am ----------------------------------------------------------------------   US  OB LESS THAN 14 WEEKS WITH OB TRANSVAGINAL Result Date: 02/02/2023 : PROCEDURE: OBSTETRIC <14 WK US  AND TRANSVAGINAL OB US  HISTORY: Patient is a 28 y/o F with viability/dating. LMP unknown. b-hCG=5537 (yesterday). COMPARISON: US  OB 01/20/23. TECHNIQUE: Two-dimensional transabdominal grayscale ultrasound imaging of the pelvis was performed with color Doppler evaluation. Transvaginal ultrasound was  also performed. FINDINGS: The uterus demonstrates a heterogeneous echotexture with multiple fibroids.  The largest fibroid, posterior intramural, measures 3.8 x 3.3 cm. The cervical os is closed. The right ovary measures 2.8 x 2.1 x 1.6 cm and demonstrates a normal homogeneous echotexture. There is normal color Doppler flow. The left ovary measures 2.7 x 2.5 x 2.5 cm and demonstrates a normal homogeneous echotexture. There is normal color Doppler flow. There is no fluid within the cul-de-sac. There is a single intrauterine gestational sac identified with a mean sac diameter of 1.0 cm, correlating to a gestational age of [redacted] weeks 5 day(s) (+/-12 day(s). A yolk sac is visualized. No fetal pole is identified at this time. There is no subchorionic hemorrhage visualized. IMPRESSION: 1. Single intrauterine gestational sac measures 5 weeks 5 day(s) by mean sac diameter. Yolk sac is visualized. No fetal pole is identified at this time. Recommend correlation with beta HCG levels and short-term follow-up ultrasound to confirm viability as clinically indicated. 2.  Multiple uterine fibroids. Thank you for allowing us  to assist in the care of this patient. Electronically Signed   By: Lynwood Mains M.D.   On: 02/02/2023 07:55   US  OB LESS THAN 14 WEEKS WITH OB TRANSVAGINAL Result Date: 01/21/2023 CLINICAL DATA:  [id number]. Pregnancy of unknown anatomic location. The patient gives history of vaginal bleeding and abdominal pain. Positive urine pregnancy test today but unknown beta HCG. EXAM: OBSTETRIC <14 WK US  AND TRANSVAGINAL OB US  TECHNIQUE: Both transabdominal and transvaginal ultrasound examinations were performed for complete evaluation of the gestation as well as the maternal uterus, adnexal regions, and pelvic cul-de-sac. Transvaginal technique was performed to assess early pregnancy. COMPARISON:  CT with IV contrast 05/06/2019. FINDINGS: Intrauterine gestational sac: None. Yolk sac:  Not visualized. Embryo:  Not visualized.  Cardiac Activity: N/a. MSD: None. CRL: None. Subchorionic hemorrhage:  None visualized. Maternal uterus/adnexae: The uterus is anteverted and measures 9.3 x 5.6 x 6.2 cm, with endometrial complex thickness of 9 mm. There are multiple scattered mural fibroids. Largest is a submucosal fibroid of the dorsal body of the uterus measuring 3.7 cm. Others are smaller. The cervix is obscured by bowel gas. The ovaries are not well seen but do not appear grossly enlarged. There is no free fluid. No adnexal mass is seen. IMPRESSION: 1. No intrauterine gestation or adnexal mass identified. 2. Differential diagnosis is mainly between early dates, failed pregnancy and ectopic pregnancy. Follow-up as indicated. 3. Fibroid uterus. 4. The ovaries are not well seen but do not appear grossly enlarged. No free fluid. Electronically Signed   By: Francis Quam M.D.   On: 01/21/2023 00:06    MAU Management/MDM: I have reviewed the triage vital signs and the nursing notes.   Pertinent labs & imaging results that were available during my care of the patient were reviewed by me and considered in my medical decision making (see chart for details).      I have reviewed her medical records including past results, notes and treatments. Medical, Surgical, and family history were reviewed.  Medications and recent lab tests were reviewed  Had US  done 02/08/23 that showed IUP so ectopic labs not ordered.  TVUS ordered showing live IUP CWD, small subchorionic hemorrhage, and fibroids with largest measuring 4.9cm.   This bleeding/pain can represent a normal pregnancy with bleeding, spontaneous abortion or even an ectopic which can be life-threatening.  The process as listed above helps to determine which of these is present.   ASSESSMENT 1. Vaginal bleeding affecting early pregnancy   2. [redacted] weeks gestation of pregnancy  3. Subchorionic hemorrhage in first trimester   4. Fibroids     PLAN Discharge home Miscarriage precautions  given Sent message to [facility] to schedule appointment  Pt stable at time of discharge. Encouraged to return here if she develops worsening of symptoms, increase in pain, fever, or other concerning symptoms.   Isaiah JONETTA Bridge, MD 02/15/2023  9:01 AM

## 2023-02-18 NOTE — BH Specialist Note (Unsigned)
 Pt did not arrive to video visit and did not answer the phone; Left HIPPA-compliant message to call back Asher Muir from [employer] for [employer] at [facility] at  [phone] [facility] office).  ?; left MyChart message for patient.  ? ?

## 2023-03-04 ENCOUNTER — Ambulatory Visit: Payer: Medicaid Other | Admitting: Clinical

## 2024-01-16 ENCOUNTER — Encounter: Payer: Self-pay | Admitting: Certified Nurse Midwife
